# Patient Record
Sex: Female | Born: 1950 | Race: Black or African American | Hispanic: No | Marital: Married | Smoking: Never smoker
Health system: Southern US, Community
[De-identification: ages and names within clinical notes are randomized; demographics above are authoritative.]

## PROBLEM LIST (undated history)

## (undated) DIAGNOSIS — Z9189 Other specified personal risk factors, not elsewhere classified: Secondary | ICD-10-CM

## (undated) DIAGNOSIS — Z86010 Personal history of colonic polyps: Secondary | ICD-10-CM

## (undated) DIAGNOSIS — E785 Hyperlipidemia, unspecified: Secondary | ICD-10-CM

## (undated) DIAGNOSIS — Z87448 Personal history of other diseases of urinary system: Secondary | ICD-10-CM

## (undated) DIAGNOSIS — J309 Allergic rhinitis, unspecified: Secondary | ICD-10-CM

## (undated) HISTORY — DX: Hyperlipidemia, unspecified: E78.5

## (undated) HISTORY — PX: BREAST SURGERY: SHX581

## (undated) HISTORY — DX: Personal history of colonic polyps: Z86.010

## (undated) HISTORY — DX: Other specified personal risk factors, not elsewhere classified: Z91.89

## (undated) HISTORY — PX: BREAST CYST EXCISION: SHX579

## (undated) HISTORY — DX: Allergic rhinitis, unspecified: J30.9

## (undated) HISTORY — DX: Personal history of other diseases of urinary system: Z87.448

---

## 1987-11-29 HISTORY — PX: ABDOMINAL HYSTERECTOMY: SHX81

## 2000-07-07 ENCOUNTER — Encounter: Payer: Self-pay | Admitting: Obstetrics and Gynecology

## 2000-07-07 ENCOUNTER — Encounter: Admission: RE | Admit: 2000-07-07 | Discharge: 2000-07-07 | Payer: Self-pay | Admitting: Obstetrics and Gynecology

## 2001-07-09 ENCOUNTER — Encounter: Payer: Self-pay | Admitting: Obstetrics and Gynecology

## 2001-07-09 ENCOUNTER — Encounter: Admission: RE | Admit: 2001-07-09 | Discharge: 2001-07-09 | Payer: Self-pay | Admitting: Obstetrics and Gynecology

## 2002-07-31 ENCOUNTER — Encounter: Payer: Self-pay | Admitting: Obstetrics and Gynecology

## 2002-07-31 ENCOUNTER — Encounter: Admission: RE | Admit: 2002-07-31 | Discharge: 2002-07-31 | Payer: Self-pay | Admitting: Obstetrics and Gynecology

## 2003-08-19 ENCOUNTER — Encounter: Payer: Self-pay | Admitting: Obstetrics and Gynecology

## 2003-08-19 ENCOUNTER — Encounter: Admission: RE | Admit: 2003-08-19 | Discharge: 2003-08-19 | Payer: Self-pay | Admitting: Obstetrics and Gynecology

## 2004-09-15 ENCOUNTER — Encounter: Admission: RE | Admit: 2004-09-15 | Discharge: 2004-09-15 | Payer: Self-pay | Admitting: Obstetrics and Gynecology

## 2005-10-04 ENCOUNTER — Encounter: Admission: RE | Admit: 2005-10-04 | Discharge: 2005-10-04 | Payer: Self-pay | Admitting: Obstetrics and Gynecology

## 2006-10-05 ENCOUNTER — Encounter: Admission: RE | Admit: 2006-10-05 | Discharge: 2006-10-05 | Payer: Self-pay | Admitting: Obstetrics and Gynecology

## 2007-10-08 ENCOUNTER — Encounter: Admission: RE | Admit: 2007-10-08 | Discharge: 2007-10-08 | Payer: Self-pay | Admitting: Obstetrics and Gynecology

## 2008-10-08 ENCOUNTER — Encounter: Admission: RE | Admit: 2008-10-08 | Discharge: 2008-10-08 | Payer: Self-pay | Admitting: Obstetrics and Gynecology

## 2009-10-09 ENCOUNTER — Encounter: Admission: RE | Admit: 2009-10-09 | Discharge: 2009-10-09 | Payer: Self-pay | Admitting: Obstetrics and Gynecology

## 2010-04-06 ENCOUNTER — Encounter: Payer: Self-pay | Admitting: Internal Medicine

## 2010-04-06 LAB — CONVERTED CEMR LAB

## 2010-04-06 LAB — HM COLONOSCOPY

## 2010-10-11 ENCOUNTER — Encounter: Admission: RE | Admit: 2010-10-11 | Discharge: 2010-10-11 | Payer: Self-pay | Admitting: Obstetrics and Gynecology

## 2010-10-11 LAB — HM MAMMOGRAPHY

## 2011-01-12 ENCOUNTER — Encounter (INDEPENDENT_AMBULATORY_CARE_PROVIDER_SITE_OTHER): Payer: Self-pay | Admitting: *Deleted

## 2011-01-12 ENCOUNTER — Other Ambulatory Visit: Payer: BC Managed Care – PPO

## 2011-01-12 ENCOUNTER — Encounter: Payer: Self-pay | Admitting: Internal Medicine

## 2011-01-12 ENCOUNTER — Ambulatory Visit (INDEPENDENT_AMBULATORY_CARE_PROVIDER_SITE_OTHER): Payer: BC Managed Care – PPO | Admitting: Internal Medicine

## 2011-01-12 ENCOUNTER — Other Ambulatory Visit: Payer: Self-pay | Admitting: Internal Medicine

## 2011-01-12 DIAGNOSIS — J309 Allergic rhinitis, unspecified: Secondary | ICD-10-CM | POA: Insufficient documentation

## 2011-01-12 DIAGNOSIS — E785 Hyperlipidemia, unspecified: Secondary | ICD-10-CM

## 2011-01-12 DIAGNOSIS — Z2911 Encounter for prophylactic immunotherapy for respiratory syncytial virus (RSV): Secondary | ICD-10-CM

## 2011-01-12 DIAGNOSIS — Z860100 Personal history of colon polyps, unspecified: Secondary | ICD-10-CM | POA: Insufficient documentation

## 2011-01-12 DIAGNOSIS — Z87448 Personal history of other diseases of urinary system: Secondary | ICD-10-CM | POA: Insufficient documentation

## 2011-01-12 DIAGNOSIS — Z9189 Other specified personal risk factors, not elsewhere classified: Secondary | ICD-10-CM

## 2011-01-12 DIAGNOSIS — Z0000 Encounter for general adult medical examination without abnormal findings: Secondary | ICD-10-CM

## 2011-01-12 DIAGNOSIS — Z23 Encounter for immunization: Secondary | ICD-10-CM

## 2011-01-12 HISTORY — DX: Personal history of other diseases of urinary system: Z87.448

## 2011-01-12 HISTORY — DX: Personal history of colonic polyps: Z86.010

## 2011-01-12 HISTORY — DX: Hyperlipidemia, unspecified: E78.5

## 2011-01-12 HISTORY — DX: Allergic rhinitis, unspecified: J30.9

## 2011-01-12 HISTORY — DX: Other specified personal risk factors, not elsewhere classified: Z91.89

## 2011-01-12 LAB — CBC WITH DIFFERENTIAL/PLATELET
Basophils Absolute: 0 10*3/uL (ref 0.0–0.1)
Basophils Relative: 0.6 % (ref 0.0–3.0)
Eosinophils Relative: 0.8 % (ref 0.0–5.0)
Hemoglobin: 13.1 g/dL (ref 12.0–15.0)
Lymphocytes Relative: 35.8 % (ref 12.0–46.0)
Lymphs Abs: 1.3 10*3/uL (ref 0.7–4.0)
Monocytes Absolute: 0.3 10*3/uL (ref 0.1–1.0)
Monocytes Relative: 9.2 % (ref 3.0–12.0)
Neutro Abs: 2 10*3/uL (ref 1.4–7.7)
RBC: 3.97 Mil/uL (ref 3.87–5.11)
RDW: 13.2 % (ref 11.5–14.6)

## 2011-01-12 LAB — BASIC METABOLIC PANEL
CO2: 31 mEq/L (ref 19–32)
Calcium: 9.6 mg/dL (ref 8.4–10.5)
Creatinine, Ser: 0.8 mg/dL (ref 0.4–1.2)
Glucose, Bld: 98 mg/dL (ref 70–99)
Potassium: 4.2 mEq/L (ref 3.5–5.1)
Sodium: 142 mEq/L (ref 135–145)

## 2011-01-12 LAB — URINALYSIS, ROUTINE W REFLEX MICROSCOPIC
Hgb urine dipstick: NEGATIVE
Ketones, ur: NEGATIVE
Specific Gravity, Urine: 1.02 (ref 1.000–1.030)
Urine Glucose: NEGATIVE
Urobilinogen, UA: 0.2 (ref 0.0–1.0)
pH: 5.5 (ref 5.0–8.0)

## 2011-01-12 LAB — HEPATIC FUNCTION PANEL
Albumin: 4.1 g/dL (ref 3.5–5.2)
Total Bilirubin: 1.5 mg/dL — ABNORMAL HIGH (ref 0.3–1.2)

## 2011-01-12 LAB — LIPID PANEL: HDL: 64.6 mg/dL (ref >39.00)

## 2011-01-19 NOTE — Assessment & Plan Note (Signed)
Summary: NEW PT/BCBS/#/LB   Vital Signs:  Patient profile:   60 year old female Height:      68 inches (172.72 cm) Weight:      136.8 pounds (62.18 kg) BMI:     20.88 O2 Sat:      99 % on Room air Temp:     98.5 degrees F (36.94 degrees C) oral Pulse rate:   66 / minute BP sitting:   110 / 68  (left arm) Cuff size:   regular  Vitals Entered By: Orlan Leavens RMA (January 12, 2011 8:13 AM)  O2 Flow:  Room air CC: New patient Is Patient Diabetic? No Pain Assessment Patient in pain? no        Primary Care Provider:  Newt Lukes MD  CC:  New patient.  History of Present Illness: new pt to me and our practice, here to est care also patient is here today for annual physical. Patient feels well and has no complaints.   Preventive Screening-Counseling & Management  Alcohol-Tobacco     Alcohol drinks/day: <1     Alcohol type: beer     Alcohol Counseling: not indicated; use of alcohol is not excessive or problematic     Smoking Status: never     Tobacco Counseling: not indicated; no tobacco use  Caffeine-Diet-Exercise     Diet Counseling: not indicated; diet is assessed to be healthy     Does Patient Exercise: yes     Type of exercise: walk     Times/week: 6     Exercise Counseling: not indicated; exercise is adequate     Depression Counseling: not indicated; screening negative for depression  Safety-Violence-Falls     Seat Belt Counseling: not indicated; patient wears seat belts     Helmet Counseling: not applicable     Firearm Counseling: not applicable     Violence Counseling: not indicated; no violence risk noted     Fall Risk Counseling: not indicated; no significant falls noted  Clinical Review Panels:  Prevention   Last Mammogram:  No specific mammographic evidence of malignancy.   (10/11/2010)   Last Pap Smear:  Interpretation Result:Negative for intraepithelial Lesion or Malignancy.    (04/06/2010)   Last Colonoscopy:  Pt states was done back in  2011, had 2 polyps removed (04/06/2010)   Current Medications (verified): 1)  Geritol Complete  Tabs (Iron-Vitamins) .... Take 1 By Mouth Once Daily 2)  Fish Oil 1000 Mg Caps (Omega-3 Fatty Acids) .... Take 1 By Mouth Once Daily 3)  Vitamin C 500 Mg Tabs (Ascorbic Acid) .... Take 1 By Mouth Once Daily 4)  Calcium-Magnesium-Zinc 333-133-8.3 Mg Tabs (Calcium-Magnesium-Zinc) .... Take 1 By Mouth Once Daily 5)  Menopause Support  Tabs (Specialty Vitamins Products) .... 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 I have reviewed all other systems and they were negative.   Physical Exam  General:  thin, fit, alert, well-developed, well-nourished, and cooperative to examination.    Head:  Normocephalic and atraumatic without obvious abnormalities. No apparent alopecia or balding. Eyes:  vision grossly intact; pupils equal, round and reactive to light.  conjunctiva and lids normal.    Ears:  R ear normal and L ear normal.   Mouth:  upper dentures and gums in good repair; mucous membranes moist, without lesions or ulcers. oropharynx clear without exudate, no erythema.  Neck:  supple, full ROM, no masses, no thyromegaly; no thyroid  nodules or tenderness. no JVD or carotid bruits.   Chest Wall:  No deformities, masses, or tenderness noted. Lungs:  normal respiratory effort, no intercostal retractions or use of accessory muscles; normal breath sounds bilaterally - no crackles and no wheezes.    Heart:  normal rate, regular rhythm, no murmur, and no rub. BLE without edema. normal DP pulses and normal cap refill in all 4 extremities    Abdomen:  soft, non-tender, normal bowel sounds, no distention; no masses and no appreciable hepatomegaly or splenomegaly.   Genitalia:  defer gyn Msk:  No deformity or scoliosis noted of thoracic or lumbar spine.   Neurologic:  alert & oriented X3 and cranial nerves II-XII symetrically intact.  strength normal in all extremities, sensation intact to light touch, and gait normal. speech fluent without dysarthria or aphasia; follows commands with good comprehension.  Skin:  tiny numerous molluscum on palmar srface of forearm B and thighs - not inflammed or pustular - no cellulitis Cervical Nodes:  No lymphadenopathy noted Axillary Nodes:  No palpable lymphadenopathy Psych:  Oriented X3, memory intact for recent and remote, normally interactive, good eye contact, not anxious appearing, not depressed appearing, and not agitated.      Impression & Recommendations:  Problem # 1:  PREVENTIVE HEALTH CARE (ICD-V70.0)  Patient has been counseled on age-appropriate routine health concerns for screening and prevention. These are reviewed and up-to-date. Immunizations are up-to-date or declined. Labs ordered and ECG reviewed: NSR, occ ectopy - no ischemic changes.   Orders: TLB-BMP (Basic Metabolic Panel-BMET) (80048-METABOL) TLB-CBC Platelet - w/Differential (85025-CBCD) TLB-Hepatic/Liver Function Pnl (80076-HEPATIC) TLB-Lipid Panel (80061-LIPID) TLB-TSH (Thyroid Stimulating Hormone) (84443-TSH) TLB-Udip w/ Micro (81001-URINE) EKG w/ Interpretation (93000)  Complete Medication List: 1)  Geritol  Complete Tabs (Iron-vitamins) .... Take 1 by mouth once daily 2)  Fish Oil 1000 Mg Caps (Omega-3 fatty acids) .... Take 1 by mouth once daily 3)  Vitamin C 500 Mg Tabs (Ascorbic acid) .... Take 1 by mouth once daily 4)  Calcium-magnesium-zinc 333-133-8.3 Mg Tabs (Calcium-magnesium-zinc) .... Take 1 by mouth once daily 5)  Menopause Support Tabs (Specialty vitamins products) .... Take 1 by mouth once daily  Other Orders: Zoster (Shingles) Vaccine Live [id number]) Admin 1st Vaccine (60454)  Patient Instructions: 1)  it was good to see you today. 2)  medical history and supplements reviewed today - no changes recommended at this time 3)  exam and EKG look good 4)  test(s) ordered today - your results will be posted on the phone tree for review in 48-72 hours from the time of test completion; call [phone] and enter your 9 digit MRN (listed above on this page, just below your name); if any changes need to be made or there are  abnormal results, you will be contacted directly.  5)  Zostavax given today - will give the Tdap next visit 6)  continue to follow with Dr. Elana Alm for PAP/pelvic as ongoing 7)  Please schedule a follow-up appointment annually for your medical physical and labs, call sooner if problems.    Orders Added: 1)  New Patient 40-64 years [99386] 2)  TLB-BMP (Basic Metabolic Panel-BMET) [80048-METABOL] 3)  TLB-CBC Platelet - w/Differential [85025-CBCD] 4)  TLB-Hepatic/Liver Function Pnl [80076-HEPATIC] 5)  TLB-Lipid Panel [80061-LIPID] 6)  TLB-TSH (Thyroid Stimulating Hormone) [84443-TSH] 7)  TLB-Udip w/ Micro [81001-URINE] 8)  EKG w/ Interpretation [93000] 9)  Zoster (Shingles) Vaccine Live [90736] 10)  Admin 1st Vaccine [42595]   Immunizations Administered:  Zostavax # 1:    Vaccine Type: Zostavax    Site: right deltoid    Mfr: Merck    Dose: 0.5 ml    Route: IM    Given by: Orlan Leavens RMA    Exp. Date: 07/16/2011    Lot #: [id number]    VIS given:  01/12/11   Immunizations Administered:  Zostavax # 1:    Vaccine Type: Zostavax    Site: right deltoid    Mfr: Merck    Dose: 0.5 ml    Route: IM    Given by: Orlan Leavens RMA    Exp. Date: 07/16/2011    Lot #: [id number]    VIS given: 01/12/11   Mammogram  Procedure date:  10/11/2010  Findings:      No specific mammographic evidence of malignancy.    Pap Smear  Procedure date:  04/06/2010  Findings:      Interpretation Result:Negative for intraepithelial Lesion or Malignancy.     Colonoscopy  Procedure date:  04/06/2010  Findings:      Pt states was done back in 2011, had 2 polyps removed

## 2011-04-13 ENCOUNTER — Ambulatory Visit: Payer: BC Managed Care – PPO | Admitting: Internal Medicine

## 2011-09-27 ENCOUNTER — Other Ambulatory Visit: Payer: Self-pay | Admitting: Internal Medicine

## 2011-09-27 DIAGNOSIS — Z1231 Encounter for screening mammogram for malignant neoplasm of breast: Secondary | ICD-10-CM

## 2011-10-17 ENCOUNTER — Ambulatory Visit
Admission: RE | Admit: 2011-10-17 | Discharge: 2011-10-17 | Disposition: A | Discharge status: Home / Self Care | Payer: BC Managed Care – PPO | Source: Ambulatory Visit | Attending: Internal Medicine | Admitting: Internal Medicine

## 2011-10-17 DIAGNOSIS — Z1231 Encounter for screening mammogram for malignant neoplasm of breast: Secondary | ICD-10-CM

## 2012-01-16 ENCOUNTER — Telehealth: Payer: Self-pay | Admitting: *Deleted

## 2012-01-16 DIAGNOSIS — Z0000 Encounter for general adult medical examination without abnormal findings: Secondary | ICD-10-CM

## 2012-01-16 NOTE — Telephone Encounter (Signed)
Received staff msg pt made cpx appt need labs entered... 01/16/12@4 :57pm/LMB

## 2012-01-27 ENCOUNTER — Other Ambulatory Visit (INDEPENDENT_AMBULATORY_CARE_PROVIDER_SITE_OTHER): Payer: BC Managed Care – PPO

## 2012-01-27 DIAGNOSIS — Z0000 Encounter for general adult medical examination without abnormal findings: Secondary | ICD-10-CM

## 2012-01-27 LAB — CBC WITH DIFFERENTIAL/PLATELET
Hemoglobin: 12.4 g/dL (ref 12.0–15.0)
Lymphocytes Relative: 40.4 % (ref 12.0–46.0)
MCHC: 34.3 g/dL (ref 30.0–36.0)
MCV: 94.5 fl (ref 78.0–100.0)
Neutro Abs: 1.6 10*3/uL (ref 1.4–7.7)
Neutrophils Relative %: 49.4 % (ref 43.0–77.0)
RBC: 3.83 Mil/uL — ABNORMAL LOW (ref 3.87–5.11)
RDW: 13.6 % (ref 11.5–14.6)
WBC: 3.2 10*3/uL — ABNORMAL LOW (ref 4.5–10.5)

## 2012-01-27 LAB — URINALYSIS, ROUTINE W REFLEX MICROSCOPIC
Ketones, ur: NEGATIVE
Nitrite: NEGATIVE
Urine Glucose: NEGATIVE
Urobilinogen, UA: 0.2 (ref 0.0–1.0)

## 2012-01-27 LAB — HEPATIC FUNCTION PANEL
AST: 25 U/L (ref 0–37)
Albumin: 4.1 g/dL (ref 3.5–5.2)
Alkaline Phosphatase: 51 U/L (ref 39–117)
Bilirubin, Direct: 0.2 mg/dL (ref 0.0–0.3)
Total Bilirubin: 1.3 mg/dL — ABNORMAL HIGH (ref 0.3–1.2)
Total Protein: 7.3 g/dL (ref 6.0–8.3)

## 2012-01-27 LAB — BASIC METABOLIC PANEL
BUN: 9 mg/dL (ref 6–23)
Calcium: 9.4 mg/dL (ref 8.4–10.5)
Chloride: 107 mEq/L (ref 96–112)
Creatinine, Ser: 0.8 mg/dL (ref 0.4–1.2)
Potassium: 4 mEq/L (ref 3.5–5.1)

## 2012-01-27 LAB — LIPID PANEL
HDL: 64.2 mg/dL (ref >39.00)
VLDL: 17 mg/dL (ref 0.0–40.0)

## 2012-01-27 LAB — LDL CHOLESTEROL, DIRECT: Direct LDL: 144.7 mg/dL

## 2012-02-06 ENCOUNTER — Encounter: Payer: BC Managed Care – PPO | Admitting: Internal Medicine

## 2012-03-09 ENCOUNTER — Encounter: Payer: Self-pay | Admitting: Internal Medicine

## 2012-04-26 ENCOUNTER — Encounter: Payer: BC Managed Care – PPO | Admitting: Internal Medicine

## 2012-09-18 ENCOUNTER — Other Ambulatory Visit: Payer: Self-pay | Admitting: Family Medicine

## 2012-09-18 DIAGNOSIS — Z1231 Encounter for screening mammogram for malignant neoplasm of breast: Secondary | ICD-10-CM

## 2012-10-23 ENCOUNTER — Ambulatory Visit
Admission: RE | Admit: 2012-10-23 | Discharge: 2012-10-23 | Disposition: A | Discharge status: Home / Self Care | Payer: BC Managed Care – PPO | Source: Ambulatory Visit | Attending: Family Medicine | Admitting: Family Medicine

## 2012-10-23 DIAGNOSIS — Z1231 Encounter for screening mammogram for malignant neoplasm of breast: Secondary | ICD-10-CM

## 2013-09-27 ENCOUNTER — Other Ambulatory Visit: Payer: Self-pay

## 2013-09-27 DIAGNOSIS — Z1231 Encounter for screening mammogram for malignant neoplasm of breast: Secondary | ICD-10-CM

## 2013-10-29 ENCOUNTER — Ambulatory Visit
Admission: RE | Admit: 2013-10-29 | Discharge: 2013-10-29 | Disposition: A | Discharge status: Home / Self Care | Payer: BC Managed Care – PPO | Source: Ambulatory Visit

## 2013-10-29 DIAGNOSIS — Z1231 Encounter for screening mammogram for malignant neoplasm of breast: Secondary | ICD-10-CM

## 2014-10-03 ENCOUNTER — Other Ambulatory Visit: Payer: Self-pay

## 2014-10-03 DIAGNOSIS — Z1231 Encounter for screening mammogram for malignant neoplasm of breast: Secondary | ICD-10-CM

## 2014-10-30 ENCOUNTER — Ambulatory Visit
Admission: RE | Admit: 2014-10-30 | Discharge: 2014-10-30 | Disposition: A | Discharge status: Home / Self Care | Payer: BC Managed Care – PPO | Source: Ambulatory Visit

## 2014-10-30 DIAGNOSIS — Z1231 Encounter for screening mammogram for malignant neoplasm of breast: Secondary | ICD-10-CM

## 2015-10-07 ENCOUNTER — Other Ambulatory Visit: Payer: Self-pay

## 2015-10-07 DIAGNOSIS — Z1231 Encounter for screening mammogram for malignant neoplasm of breast: Secondary | ICD-10-CM

## 2015-11-09 ENCOUNTER — Ambulatory Visit: Payer: BC Managed Care – PPO

## 2015-12-04 ENCOUNTER — Ambulatory Visit
Admission: RE | Admit: 2015-12-04 | Discharge: 2015-12-04 | Disposition: A | Discharge status: Home / Self Care | Payer: Medicare Other | Source: Ambulatory Visit

## 2015-12-04 DIAGNOSIS — Z1231 Encounter for screening mammogram for malignant neoplasm of breast: Secondary | ICD-10-CM

## 2016-11-04 ENCOUNTER — Other Ambulatory Visit: Payer: Self-pay | Admitting: Family Medicine

## 2016-11-04 DIAGNOSIS — Z1231 Encounter for screening mammogram for malignant neoplasm of breast: Secondary | ICD-10-CM

## 2016-12-07 ENCOUNTER — Ambulatory Visit
Admission: RE | Admit: 2016-12-07 | Discharge: 2016-12-07 | Disposition: A | Discharge status: Home / Self Care | Payer: Medicare Other | Source: Ambulatory Visit | Attending: Family Medicine | Admitting: Family Medicine

## 2016-12-07 DIAGNOSIS — Z1231 Encounter for screening mammogram for malignant neoplasm of breast: Secondary | ICD-10-CM

## 2017-11-07 ENCOUNTER — Other Ambulatory Visit: Payer: Self-pay | Admitting: Family Medicine

## 2017-11-07 DIAGNOSIS — Z1231 Encounter for screening mammogram for malignant neoplasm of breast: Secondary | ICD-10-CM

## 2017-12-08 ENCOUNTER — Ambulatory Visit
Admission: RE | Admit: 2017-12-08 | Discharge: 2017-12-08 | Disposition: A | Discharge status: Home / Self Care | Payer: Medicare Other | Source: Ambulatory Visit | Attending: Family Medicine | Admitting: Family Medicine

## 2017-12-08 DIAGNOSIS — Z1231 Encounter for screening mammogram for malignant neoplasm of breast: Secondary | ICD-10-CM

## 2018-11-09 ENCOUNTER — Other Ambulatory Visit: Payer: Self-pay | Admitting: Family Medicine

## 2018-11-09 DIAGNOSIS — Z1231 Encounter for screening mammogram for malignant neoplasm of breast: Secondary | ICD-10-CM

## 2018-12-17 ENCOUNTER — Ambulatory Visit
Admission: RE | Admit: 2018-12-17 | Discharge: 2018-12-17 | Disposition: A | Discharge status: Home / Self Care | Payer: Medicare Other | Source: Ambulatory Visit | Attending: Family Medicine | Admitting: Family Medicine

## 2018-12-17 DIAGNOSIS — Z1231 Encounter for screening mammogram for malignant neoplasm of breast: Secondary | ICD-10-CM

## 2019-02-25 ENCOUNTER — Other Ambulatory Visit: Payer: Self-pay | Admitting: Family Medicine

## 2019-02-25 DIAGNOSIS — M8580 Other specified disorders of bone density and structure, unspecified site: Secondary | ICD-10-CM

## 2019-07-10 ENCOUNTER — Other Ambulatory Visit: Payer: Self-pay

## 2019-07-10 ENCOUNTER — Ambulatory Visit
Admission: RE | Admit: 2019-07-10 | Discharge: 2019-07-10 | Disposition: A | Discharge status: Home / Self Care | Payer: Medicare Other | Source: Ambulatory Visit | Attending: Family Medicine | Admitting: Family Medicine

## 2019-07-10 DIAGNOSIS — M8580 Other specified disorders of bone density and structure, unspecified site: Secondary | ICD-10-CM

## 2019-12-06 ENCOUNTER — Other Ambulatory Visit: Payer: Self-pay | Admitting: Family Medicine

## 2019-12-06 DIAGNOSIS — Z1231 Encounter for screening mammogram for malignant neoplasm of breast: Secondary | ICD-10-CM

## 2020-01-14 ENCOUNTER — Other Ambulatory Visit: Payer: Self-pay

## 2020-01-14 ENCOUNTER — Ambulatory Visit
Admission: RE | Admit: 2020-01-14 | Discharge: 2020-01-14 | Disposition: A | Discharge status: Home / Self Care | Payer: Medicare PPO | Source: Ambulatory Visit | Attending: Family Medicine | Admitting: Family Medicine

## 2020-01-14 DIAGNOSIS — Z1231 Encounter for screening mammogram for malignant neoplasm of breast: Secondary | ICD-10-CM

## 2020-09-18 DIAGNOSIS — Z23 Encounter for immunization: Secondary | ICD-10-CM | POA: Diagnosis not present

## 2020-10-10 ENCOUNTER — Ambulatory Visit: Payer: Medicare PPO | Attending: Internal Medicine

## 2020-10-10 DIAGNOSIS — Z23 Encounter for immunization: Secondary | ICD-10-CM

## 2020-10-10 NOTE — Progress Notes (Signed)
° °  Covid-19 Vaccination Clinic  Name:  Jasmine Rios    MRN: [id number] DOB: 07-Dec-1950  10/10/2020  [name] was observed post Covid-19 immunization for 15 minutes without incident. She was provided with Vaccine Information Sheet and instruction to access the V-Safe system.   [name] was instructed to call 911 with any severe reactions post vaccine:  Difficulty breathing   Swelling of face and throat   A fast heartbeat   A bad rash all over body   Dizziness and weakness   Immunizations Administered    Name Date Dose VIS Date Route   Pfizer COVID-19 Vaccine 10/10/2020  3:27 PM 0.3 mL 09/16/2020 Intramuscular   Manufacturer: [employer], [employer]   Lot: [id number]   NDC: [address]-8

## 2020-12-07 ENCOUNTER — Other Ambulatory Visit: Payer: Self-pay | Admitting: Family Medicine

## 2020-12-07 DIAGNOSIS — Z1231 Encounter for screening mammogram for malignant neoplasm of breast: Secondary | ICD-10-CM

## 2021-01-04 DIAGNOSIS — H5203 Hypermetropia, bilateral: Secondary | ICD-10-CM | POA: Diagnosis not present

## 2021-01-04 DIAGNOSIS — H53032 Strabismic amblyopia, left eye: Secondary | ICD-10-CM | POA: Diagnosis not present

## 2021-01-04 DIAGNOSIS — H52223 Regular astigmatism, bilateral: Secondary | ICD-10-CM | POA: Diagnosis not present

## 2021-01-04 DIAGNOSIS — H524 Presbyopia: Secondary | ICD-10-CM | POA: Diagnosis not present

## 2021-01-04 DIAGNOSIS — H2513 Age-related nuclear cataract, bilateral: Secondary | ICD-10-CM | POA: Diagnosis not present

## 2021-01-15 ENCOUNTER — Ambulatory Visit
Admission: RE | Admit: 2021-01-15 | Discharge: 2021-01-15 | Disposition: A | Discharge status: Home / Self Care | Payer: Medicare PPO | Source: Ambulatory Visit | Attending: Family Medicine | Admitting: Family Medicine

## 2021-01-15 ENCOUNTER — Other Ambulatory Visit: Payer: Self-pay

## 2021-01-15 DIAGNOSIS — Z1231 Encounter for screening mammogram for malignant neoplasm of breast: Secondary | ICD-10-CM

## 2021-01-21 ENCOUNTER — Other Ambulatory Visit: Payer: Self-pay | Admitting: Family Medicine

## 2021-01-21 DIAGNOSIS — R928 Other abnormal and inconclusive findings on diagnostic imaging of breast: Secondary | ICD-10-CM

## 2021-02-08 ENCOUNTER — Ambulatory Visit
Admission: RE | Admit: 2021-02-08 | Discharge: 2021-02-08 | Disposition: A | Discharge status: Home / Self Care | Payer: Medicare PPO | Source: Ambulatory Visit | Attending: Family Medicine | Admitting: Family Medicine

## 2021-02-08 ENCOUNTER — Other Ambulatory Visit: Payer: Self-pay

## 2021-02-08 ENCOUNTER — Ambulatory Visit: Payer: Medicare PPO

## 2021-02-08 DIAGNOSIS — R928 Other abnormal and inconclusive findings on diagnostic imaging of breast: Secondary | ICD-10-CM | POA: Diagnosis not present

## 2021-04-05 DIAGNOSIS — E7800 Pure hypercholesterolemia, unspecified: Secondary | ICD-10-CM | POA: Diagnosis not present

## 2021-04-05 DIAGNOSIS — Z0000 Encounter for general adult medical examination without abnormal findings: Secondary | ICD-10-CM | POA: Diagnosis not present

## 2021-06-21 DIAGNOSIS — Z23 Encounter for immunization: Secondary | ICD-10-CM | POA: Diagnosis not present

## 2021-09-29 DIAGNOSIS — Z23 Encounter for immunization: Secondary | ICD-10-CM | POA: Diagnosis not present

## 2021-12-10 DIAGNOSIS — Z23 Encounter for immunization: Secondary | ICD-10-CM | POA: Diagnosis not present

## 2022-01-06 ENCOUNTER — Other Ambulatory Visit: Payer: Self-pay | Admitting: Family Medicine

## 2022-01-06 DIAGNOSIS — Z1231 Encounter for screening mammogram for malignant neoplasm of breast: Secondary | ICD-10-CM

## 2022-01-10 DIAGNOSIS — H524 Presbyopia: Secondary | ICD-10-CM | POA: Diagnosis not present

## 2022-01-10 DIAGNOSIS — H2513 Age-related nuclear cataract, bilateral: Secondary | ICD-10-CM | POA: Diagnosis not present

## 2022-01-10 DIAGNOSIS — H52223 Regular astigmatism, bilateral: Secondary | ICD-10-CM | POA: Diagnosis not present

## 2022-01-10 DIAGNOSIS — H5203 Hypermetropia, bilateral: Secondary | ICD-10-CM | POA: Diagnosis not present

## 2022-01-20 ENCOUNTER — Ambulatory Visit
Admission: RE | Admit: 2022-01-20 | Discharge: 2022-01-20 | Disposition: A | Discharge status: Home / Self Care | Payer: Medicare PPO | Source: Ambulatory Visit | Attending: Family Medicine | Admitting: Family Medicine

## 2022-01-20 DIAGNOSIS — Z1231 Encounter for screening mammogram for malignant neoplasm of breast: Secondary | ICD-10-CM | POA: Diagnosis not present

## 2022-02-19 IMAGING — MG MM DIGITAL DIAGNOSTIC UNILAT*L* W/ TOMO W/ CAD
6 series · 6 of 18 positions shown · non-contrast
Comparison: Previous exam(s).

CLINICAL DATA: Screening recall for a possible left breast
asymmetry.

EXAM:
DIGITAL DIAGNOSTIC UNILATERAL LEFT MAMMOGRAM WITH TOMOSYNTHESIS AND
CAD
TECHNIQUE: Left digital diagnostic mammography and breast tomosynthesis was
performed. The images were evaluated with computer-aided detection.

[L MLO synth-2D (1 of 2)]
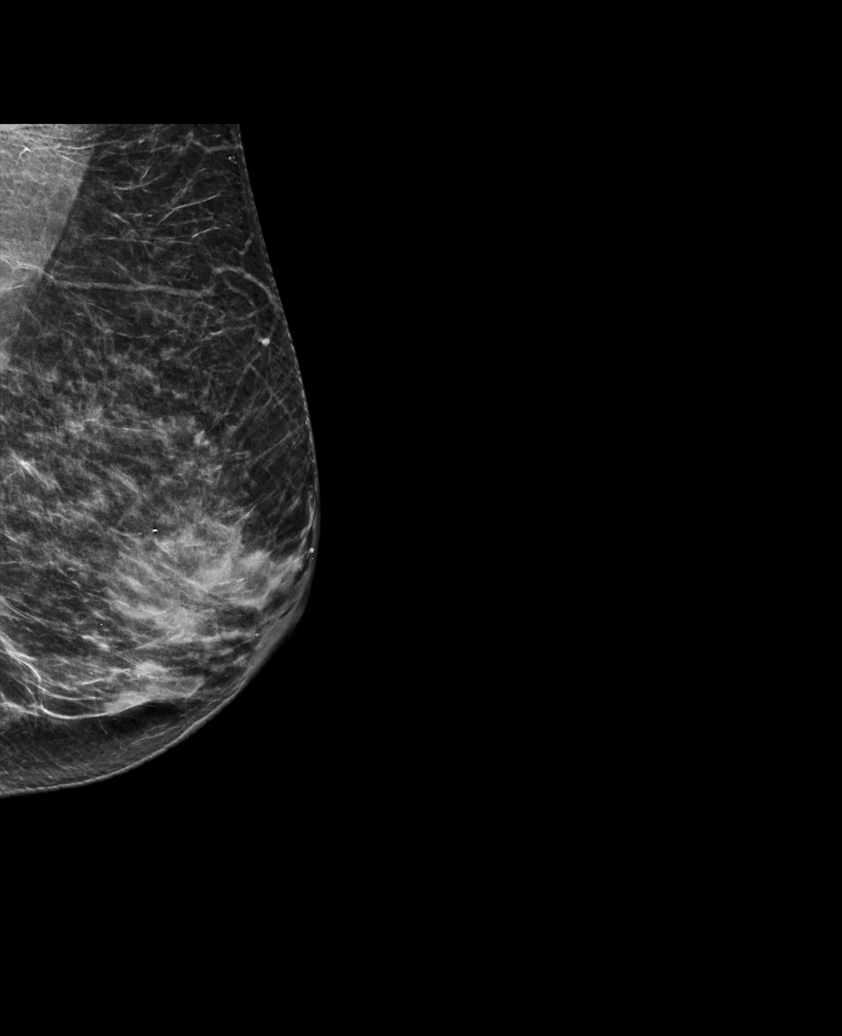

[L MLO synth-2D (2 of 2)]
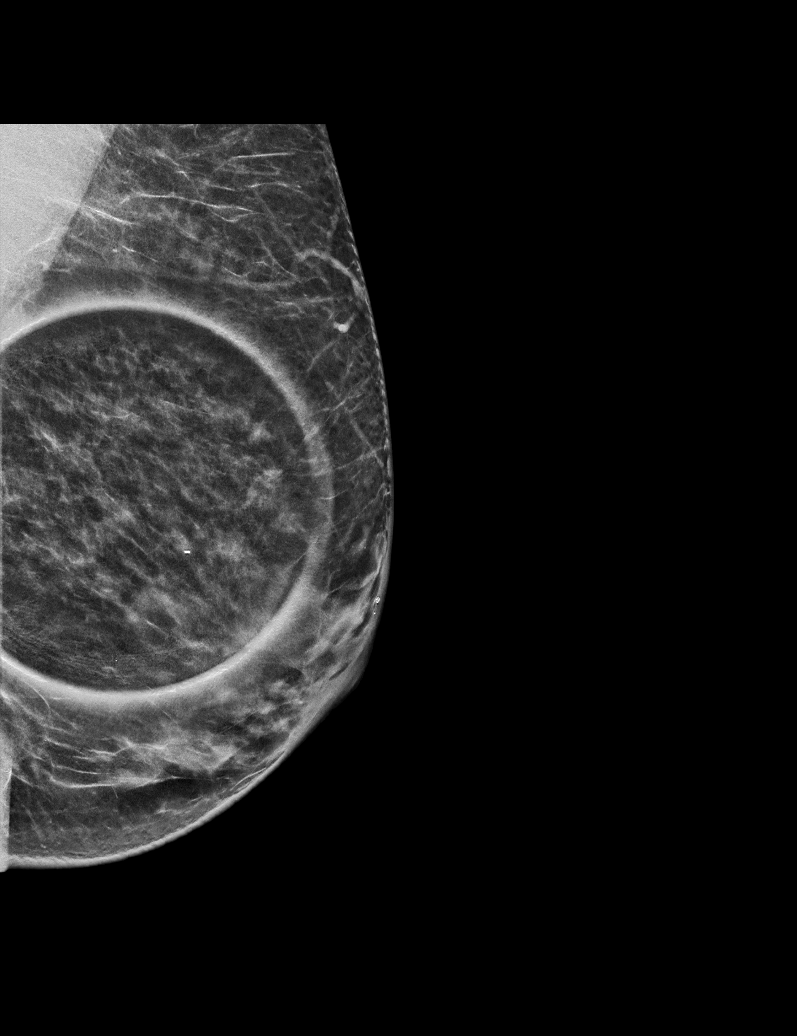

[L ML synth-2D]
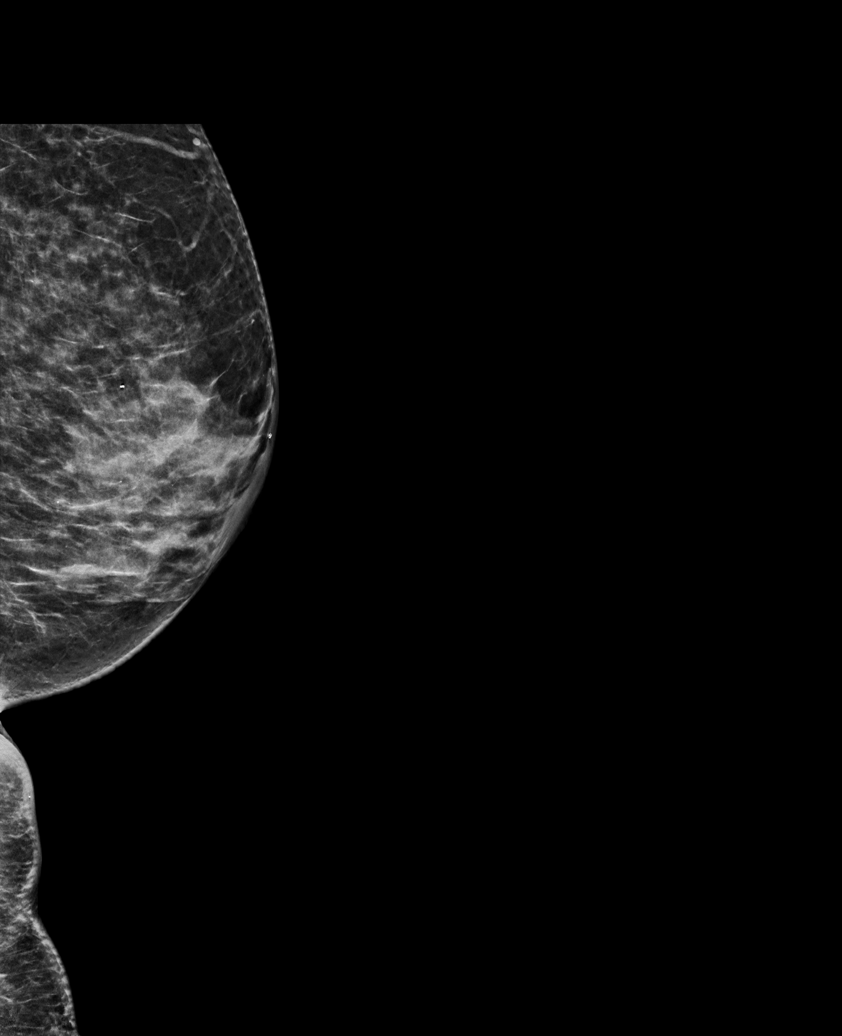

[L ML tomo · tomo slice 32/63.0]
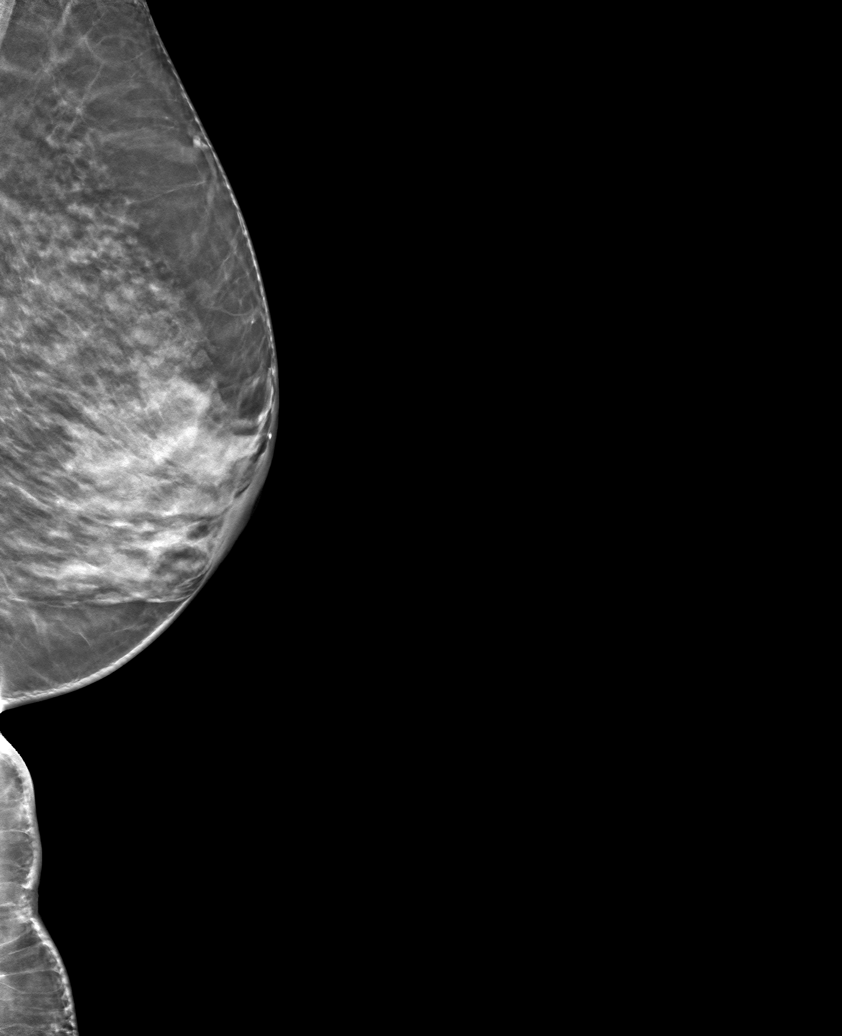

[L MLO tomo (1 of 2) · tomo slice 31/60.0]
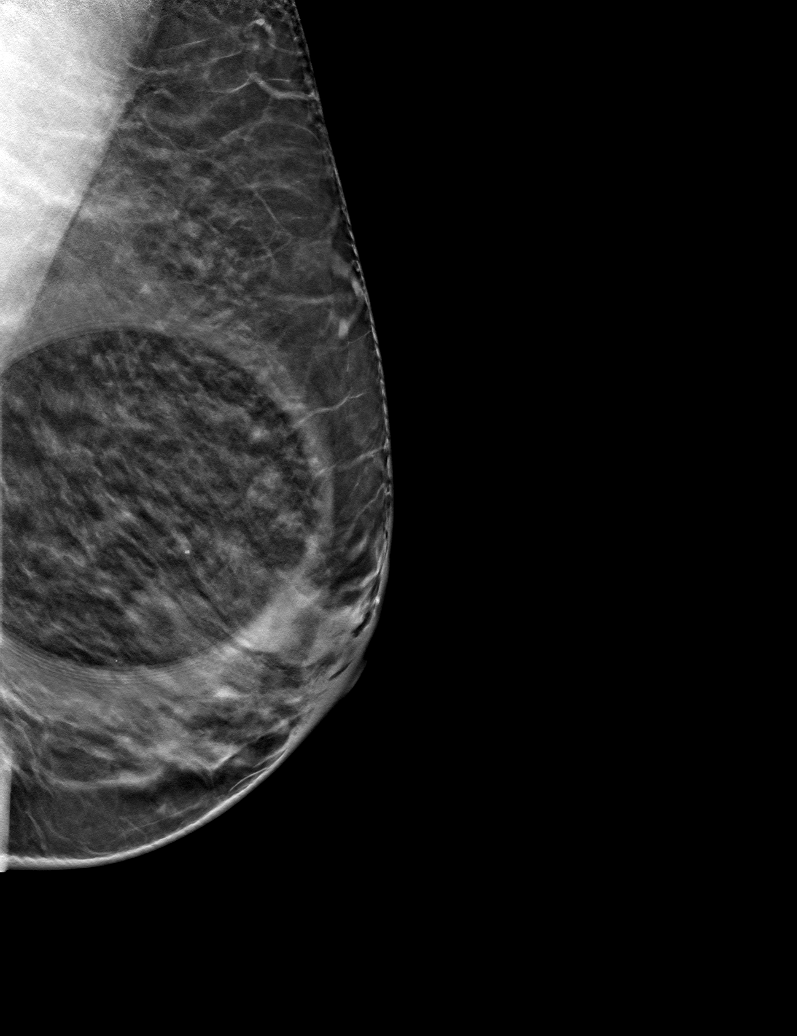

[L MLO tomo (2 of 2) · tomo slice 31/62.0]
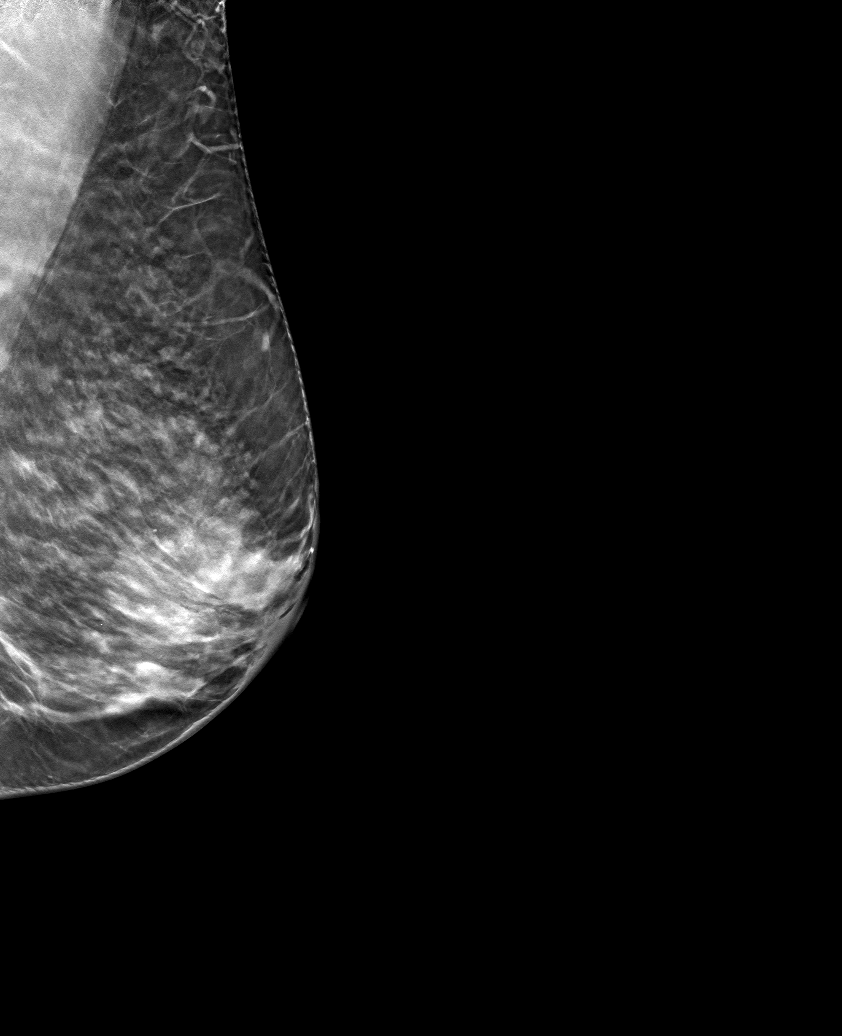

[6 of 18 positions shown; findings below may reference images not displayed]

ACR Breast Density Category c: The breast tissue is heterogeneously
dense, which may obscure small masses.
FINDINGS: The possible left breast asymmetry, which was noted on 1 of 2 of the
left breast screening MLO views, disperses with spot compression
imaging consistent with superimposed fibroglandular tissue. There is
no underlying mass or significant residual asymmetry. There are no
areas of architectural distortion and no suspicious calcifications.
IMPRESSION: No evidence of breast malignancy.

RECOMMENDATION:
Screening mammogram in one year.(Code:[id number])

I have discussed the findings and recommendations with the patient.
If applicable, a reminder letter will be sent to the patient
regarding the next appointment.

BI-RADS CATEGORY  1: Negative.

## 2022-07-29 DIAGNOSIS — M8580 Other specified disorders of bone density and structure, unspecified site: Secondary | ICD-10-CM | POA: Diagnosis not present

## 2022-07-29 DIAGNOSIS — Z86010 Personal history of colonic polyps: Secondary | ICD-10-CM | POA: Diagnosis not present

## 2022-07-29 DIAGNOSIS — Z0000 Encounter for general adult medical examination without abnormal findings: Secondary | ICD-10-CM | POA: Diagnosis not present

## 2022-07-29 DIAGNOSIS — E7800 Pure hypercholesterolemia, unspecified: Secondary | ICD-10-CM | POA: Diagnosis not present

## 2022-07-29 DIAGNOSIS — Z23 Encounter for immunization: Secondary | ICD-10-CM | POA: Diagnosis not present

## 2022-12-16 ENCOUNTER — Other Ambulatory Visit: Payer: Self-pay | Admitting: Family Medicine

## 2022-12-16 DIAGNOSIS — Z1231 Encounter for screening mammogram for malignant neoplasm of breast: Secondary | ICD-10-CM

## 2022-12-23 DIAGNOSIS — Z86010 Personal history of colonic polyps: Secondary | ICD-10-CM | POA: Diagnosis not present

## 2022-12-23 DIAGNOSIS — K648 Other hemorrhoids: Secondary | ICD-10-CM | POA: Diagnosis not present

## 2022-12-23 DIAGNOSIS — D123 Benign neoplasm of transverse colon: Secondary | ICD-10-CM | POA: Diagnosis not present

## 2022-12-23 DIAGNOSIS — K644 Residual hemorrhoidal skin tags: Secondary | ICD-10-CM | POA: Diagnosis not present

## 2022-12-23 DIAGNOSIS — Z09 Encounter for follow-up examination after completed treatment for conditions other than malignant neoplasm: Secondary | ICD-10-CM | POA: Diagnosis not present

## 2022-12-27 DIAGNOSIS — D123 Benign neoplasm of transverse colon: Secondary | ICD-10-CM | POA: Diagnosis not present

## 2023-01-16 DIAGNOSIS — H2513 Age-related nuclear cataract, bilateral: Secondary | ICD-10-CM | POA: Diagnosis not present

## 2023-01-16 DIAGNOSIS — H52223 Regular astigmatism, bilateral: Secondary | ICD-10-CM | POA: Diagnosis not present

## 2023-01-16 DIAGNOSIS — H5203 Hypermetropia, bilateral: Secondary | ICD-10-CM | POA: Diagnosis not present

## 2023-01-16 DIAGNOSIS — H524 Presbyopia: Secondary | ICD-10-CM | POA: Diagnosis not present

## 2023-02-03 ENCOUNTER — Ambulatory Visit
Admission: RE | Admit: 2023-02-03 | Discharge: 2023-02-03 | Disposition: A | Discharge status: Home / Self Care | Payer: Medicare PPO | Source: Ambulatory Visit | Attending: Family Medicine | Admitting: Family Medicine

## 2023-02-03 DIAGNOSIS — Z1231 Encounter for screening mammogram for malignant neoplasm of breast: Secondary | ICD-10-CM

## 2023-08-07 DIAGNOSIS — Z23 Encounter for immunization: Secondary | ICD-10-CM | POA: Diagnosis not present

## 2023-08-07 DIAGNOSIS — E7800 Pure hypercholesterolemia, unspecified: Secondary | ICD-10-CM | POA: Diagnosis not present

## 2023-08-07 DIAGNOSIS — Z0000 Encounter for general adult medical examination without abnormal findings: Secondary | ICD-10-CM | POA: Diagnosis not present

## 2024-01-01 ENCOUNTER — Other Ambulatory Visit: Payer: Self-pay | Admitting: Family Medicine

## 2024-01-01 DIAGNOSIS — Z1231 Encounter for screening mammogram for malignant neoplasm of breast: Secondary | ICD-10-CM

## 2024-01-22 DIAGNOSIS — H2513 Age-related nuclear cataract, bilateral: Secondary | ICD-10-CM | POA: Diagnosis not present

## 2024-01-22 DIAGNOSIS — H524 Presbyopia: Secondary | ICD-10-CM | POA: Diagnosis not present

## 2024-01-22 DIAGNOSIS — H5203 Hypermetropia, bilateral: Secondary | ICD-10-CM | POA: Diagnosis not present

## 2024-01-22 DIAGNOSIS — H52222 Regular astigmatism, left eye: Secondary | ICD-10-CM | POA: Diagnosis not present

## 2024-02-05 ENCOUNTER — Ambulatory Visit
Admission: RE | Admit: 2024-02-05 | Discharge: 2024-02-05 | Disposition: A | Discharge status: Home / Self Care | Payer: Medicare PPO | Source: Ambulatory Visit | Attending: Family Medicine | Admitting: Family Medicine

## 2024-02-05 DIAGNOSIS — Z1231 Encounter for screening mammogram for malignant neoplasm of breast: Secondary | ICD-10-CM | POA: Diagnosis not present

## 2024-08-19 DIAGNOSIS — Z0000 Encounter for general adult medical examination without abnormal findings: Secondary | ICD-10-CM | POA: Diagnosis not present

## 2024-08-19 DIAGNOSIS — G4762 Sleep related leg cramps: Secondary | ICD-10-CM | POA: Diagnosis not present

## 2024-08-19 DIAGNOSIS — E7800 Pure hypercholesterolemia, unspecified: Secondary | ICD-10-CM | POA: Diagnosis not present

## 2024-08-19 DIAGNOSIS — Z23 Encounter for immunization: Secondary | ICD-10-CM | POA: Diagnosis not present
